# Patient Record
Sex: Female | Born: 1995 | Race: Black or African American | Hispanic: No | Marital: Single | Smoking: Current every day smoker
Health system: Southern US, Community
[De-identification: ages and names within clinical notes are randomized; demographics above are authoritative.]

## PROBLEM LIST (undated history)

## (undated) DIAGNOSIS — F419 Anxiety disorder, unspecified: Secondary | ICD-10-CM

---

## 2015-07-13 ENCOUNTER — Emergency Department (HOSPITAL_COMMUNITY): Payer: Self-pay

## 2015-07-13 ENCOUNTER — Emergency Department (HOSPITAL_COMMUNITY)
Admission: EM | Admit: 2015-07-13 | Discharge: 2015-07-13 | Disposition: A | Discharge status: Home / Self Care | Payer: Self-pay | Attending: Emergency Medicine | Admitting: Emergency Medicine

## 2015-07-13 ENCOUNTER — Encounter (HOSPITAL_COMMUNITY): Payer: Self-pay | Admitting: Emergency Medicine

## 2015-07-13 DIAGNOSIS — Z3202 Encounter for pregnancy test, result negative: Secondary | ICD-10-CM | POA: Insufficient documentation

## 2015-07-13 DIAGNOSIS — Z720 Tobacco use: Secondary | ICD-10-CM | POA: Insufficient documentation

## 2015-07-13 DIAGNOSIS — R079 Chest pain, unspecified: Secondary | ICD-10-CM | POA: Insufficient documentation

## 2015-07-13 HISTORY — DX: Anxiety disorder, unspecified: F41.9

## 2015-07-13 LAB — CBC WITH DIFFERENTIAL/PLATELET
Basophils Absolute: 0 10*3/uL (ref 0.0–0.1)
Basophils Relative: 0 % (ref 0–1)
Eosinophils Absolute: 0.2 10*3/uL (ref 0.0–0.7)
Eosinophils Relative: 2 % (ref 0–5)
HCT: 39.7 % (ref 36.0–46.0)
Hemoglobin: 12.9 g/dL (ref 12.0–15.0)
Lymphocytes Relative: 27 % (ref 12–46)
Lymphs Abs: 1.7 10*3/uL (ref 0.7–4.0)
MCH: 28 pg (ref 26.0–34.0)
MCHC: 32.5 g/dL (ref 30.0–36.0)
MCV: 86.1 fL (ref 78.0–100.0)
Monocytes Absolute: 0.4 10*3/uL (ref 0.1–1.0)
Monocytes Relative: 6 % (ref 3–12)
Neutro Abs: 4.2 10*3/uL (ref 1.7–7.7)
Neutrophils Relative %: 65 % (ref 43–77)
Platelets: 197 10*3/uL (ref 150–400)
RBC: 4.61 MIL/uL (ref 3.87–5.11)
RDW: 13.1 % (ref 11.5–15.5)
WBC: 6.5 10*3/uL (ref 4.0–10.5)

## 2015-07-13 LAB — URINALYSIS, ROUTINE W REFLEX MICROSCOPIC
Bilirubin Urine: NEGATIVE
Glucose, UA: NEGATIVE mg/dL
Hgb urine dipstick: NEGATIVE
Ketones, ur: NEGATIVE mg/dL
Leukocytes, UA: NEGATIVE
Nitrite: NEGATIVE
Protein, ur: NEGATIVE mg/dL
Specific Gravity, Urine: 1.01 (ref 1.005–1.030)
Urobilinogen, UA: 0.2 mg/dL (ref 0.0–1.0)
pH: 7 (ref 5.0–8.0)

## 2015-07-13 LAB — BASIC METABOLIC PANEL
Anion gap: 8 (ref 5–15)
BUN: 13 mg/dL (ref 6–20)
CO2: 27 mmol/L (ref 22–32)
Calcium: 9.4 mg/dL (ref 8.9–10.3)
Chloride: 102 mmol/L (ref 101–111)
Creatinine, Ser: 0.76 mg/dL (ref 0.44–1.00)
GFR calc Af Amer: >60 mL/min (ref >60)
GFR calc non Af Amer: >60 mL/min (ref >60)
Glucose, Bld: 104 mg/dL — ABNORMAL HIGH (ref 65–99)
Potassium: 3.9 mmol/L (ref 3.5–5.1)
Sodium: 137 mmol/L (ref 135–145)

## 2015-07-13 LAB — POC URINE PREG, ED: Preg Test, Ur: NEGATIVE

## 2015-07-13 NOTE — Discharge Instructions (Signed)
Chest Pain (Nonspecific) °It is often hard to give a specific diagnosis for the cause of chest pain. There is always a chance that your pain could be related to something serious, such as a heart attack or a blood clot in the lungs. You need to follow up with your health care provider for further evaluation. °CAUSES  °· Heartburn. °· Pneumonia or bronchitis. °· Anxiety or stress. °· Inflammation around your heart (pericarditis) or lung (pleuritis or pleurisy). °· A blood clot in the lung. °· A collapsed lung (pneumothorax). It can develop suddenly on its own (spontaneous pneumothorax) or from trauma to the chest. °· Shingles infection (herpes zoster virus). °The chest wall is composed of bones, muscles, and cartilage. Any of these can be the source of the pain. °· The bones can be bruised by injury. °· The muscles or cartilage can be strained by coughing or overwork. °· The cartilage can be affected by inflammation and become sore (costochondritis). °DIAGNOSIS  °Lab tests or other studies may be needed to find the cause of your pain. Your health care provider may have you take a test called an ambulatory electrocardiogram (ECG). An ECG records your heartbeat patterns over a 24-hour period. You may also have other tests, such as: °· Transthoracic echocardiogram (TTE). During echocardiography, sound waves are used to evaluate how blood flows through your heart. °· Transesophageal echocardiogram (TEE). °· Cardiac monitoring. This allows your health care provider to monitor your heart rate and rhythm in real time. °· Holter monitor. This is a portable device that records your heartbeat and can help diagnose heart arrhythmias. It allows your health care provider to track your heart activity for several days, if needed. °· Stress tests by exercise or by giving medicine that makes the heart beat faster. °TREATMENT  °· Treatment depends on what may be causing your chest pain. Treatment may include: °· Acid blockers for  heartburn. °· Anti-inflammatory medicine. °· Pain medicine for inflammatory conditions. °· Antibiotics if an infection is present. °· You may be advised to change lifestyle habits. This includes stopping smoking and avoiding alcohol, caffeine, and chocolate. °· You may be advised to keep your head raised (elevated) when sleeping. This reduces the chance of acid going backward from your stomach into your esophagus. °Most of the time, nonspecific chest pain will improve within 2-3 days with rest and mild pain medicine.  °HOME CARE INSTRUCTIONS  °· If antibiotics were prescribed, take them as directed. Finish them even if you start to feel better. °· For the next few days, avoid physical activities that bring on chest pain. Continue physical activities as directed. °· Do not use any tobacco products, including cigarettes, chewing tobacco, or electronic cigarettes. °· Avoid drinking alcohol. °· Only take medicine as directed by your health care provider. °· Follow your health care provider's suggestions for further testing if your chest pain does not go away. °· Keep any follow-up appointments you made. If you do not go to an appointment, you could develop lasting (chronic) problems with pain. If there is any problem keeping an appointment, call to reschedule. °SEEK MEDICAL CARE IF:  °· Your chest pain does not go away, even after treatment. °· You have a rash with blisters on your chest. °· You have a fever. °SEEK IMMEDIATE MEDICAL CARE IF:  °· You have increased chest pain or pain that spreads to your arm, neck, jaw, back, or abdomen. °· You have shortness of breath. °· You have an increasing cough, or you cough   up blood. °· You have severe back or abdominal pain. °· You feel nauseous or vomit. °· You have severe weakness. °· You faint. °· You have chills. °This is an emergency. Do not wait to see if the pain will go away. Get medical help at once. Call your local emergency services (911 in [place] Do not drive  yourself to the hospital. °MAKE SURE YOU:  °· Understand these instructions. °· Will watch your condition. °· Will get help right away if you are not doing well or get worse. °Document Released: 09/04/2005 Document Revised: 11/30/2013 Document Reviewed: 06/30/2008 °ExitCare® Patient Information ©2015 [employer]. This information is not intended to replace advice given to you by your health care provider. Make sure you discuss any questions you have with your health care provider. ° °Panic Attacks °Panic attacks are sudden, short-lived surges of severe anxiety, fear, or discomfort. They may occur for no reason when you are relaxed, when you are anxious, or when you are sleeping. Panic attacks may occur for a number of reasons:  °· Healthy people occasionally have panic attacks in extreme, life-threatening situations, such as war or natural disasters. Normal anxiety is a protective mechanism of the body that helps us react to danger (fight or flight response). °· Panic attacks are often seen with anxiety disorders, such as panic disorder, social anxiety disorder, generalized anxiety disorder, and phobias. Anxiety disorders cause excessive or uncontrollable anxiety. They may interfere with your relationships or other life activities. °· Panic attacks are sometimes seen with other mental illnesses, such as depression and posttraumatic stress disorder. °· Certain medical conditions, prescription medicines, and drugs of abuse can cause panic attacks. °SYMPTOMS  °Panic attacks start suddenly, peak within 20 minutes, and are accompanied by four or more of the following symptoms: °· Pounding heart or fast heart rate (palpitations). °· Sweating. °· Trembling or shaking. °· Shortness of breath or feeling smothered. °· Feeling choked. °· Chest pain or discomfort. °· Nausea or strange feeling in your stomach. °· Dizziness, light-headedness, or feeling like you will faint. °· Chills or hot flushes. °· Numbness or tingling in  your lips or hands and feet. °· Feeling that things are not real or feeling that you are not yourself. °· Fear of losing control or going crazy. °· Fear of dying. °Some of these symptoms can mimic serious medical conditions. For example, you may think you are having a heart attack. Although panic attacks can be very scary, they are not life threatening. °DIAGNOSIS  °Panic attacks are diagnosed through an assessment by your health care provider. Your health care provider will ask questions about your symptoms, such as where and when they occurred. Your health care provider will also ask about your medical history and use of alcohol and drugs, including prescription medicines. Your health care provider may order blood tests or other studies to rule out a serious medical condition. Your health care provider may refer you to a mental health professional for further evaluation. °TREATMENT  °· Most healthy people who have one or two panic attacks in an extreme, life-threatening situation will not require treatment. °· The treatment for panic attacks associated with anxiety disorders or other mental illness typically involves counseling with a mental health professional, medicine, or a combination of both. Your health care provider will help determine what treatment is best for you. °· Panic attacks due to physical illness usually go away with treatment of the illness. If prescription medicine is causing panic attacks, talk with your health care   provider about stopping the medicine, decreasing the dose, or substituting another medicine. °· Panic attacks due to alcohol or drug abuse go away with abstinence. Some adults need professional help in order to stop drinking or using drugs. °HOME CARE INSTRUCTIONS  °· Take all medicines as directed by your health care provider.   °· Schedule and attend follow-up visits as directed by your health care provider. It is important to keep all your appointments. °SEEK MEDICAL CARE  IF: °· You are not able to take your medicines as prescribed. °· Your symptoms do not improve or get worse. °SEEK IMMEDIATE MEDICAL CARE IF:  °· You experience panic attack symptoms that are different than your usual symptoms. °· You have serious thoughts about hurting yourself or others. °· You are taking medicine for panic attacks and have a serious side effect. °MAKE SURE YOU: °· Understand these instructions. °· Will watch your condition. °· Will get help right away if you are not doing well or get worse. °Document Released: 11/25/2005 Document Revised: 11/30/2013 Document Reviewed: 07/09/2013 °ExitCare® Patient Information ©2015 [employer]. This information is not intended to replace advice given to you by your health care provider. Make sure you discuss any questions you have with your health care provider. ° °

## 2015-07-13 NOTE — ED Notes (Signed)
Pt comes in today with EMS from home. Pts boyfriend states they were laying in bed when the pt began to shake and panic. Pt stated to boyfriend that she needed to go to the hospital. Pt actively crying and shaking at bedside at this time.

## 2015-07-13 NOTE — ED Provider Notes (Signed)
CSN: [id number]     Arrival date & time 07/13/15  9562 History   First MD Initiated Contact with Patient 07/13/15 0930     Chief Complaint  Patient presents with  . Panic Attack     (Consider location/radiation/quality/duration/timing/severity/associated sxs/prior Treatment) Patient is a 19 y.o. female presenting with chest pain.  Chest Pain Pain location:  Substernal area Pain quality: sharp   Pain radiates to:  Does not radiate Pain radiates to the back: no   Pain severity:  Moderate Onset quality:  Gradual Duration:  1 day Timing:  Constant Chronicity:  Recurrent Context: not breathing   Context comment:  Increased life stressors, states it feels identical to panic attacks Relieved by:  Nothing Worsened by:  Nothing tried Associated symptoms: no abdominal pain, no fever, no nausea and not vomiting   Associated symptoms comment:  R foot pain   Past Medical History  Diagnosis Date  . Anxiety    History reviewed. No pertinent past surgical history. History reviewed. No pertinent family history. History  Substance Use Topics  . Smoking status: Current Every Day Smoker -- 0.50 packs/day    Types: Cigarettes  . Smokeless tobacco: Never Used  . Alcohol Use: No   OB History    No data available     Review of Systems  Constitutional: Negative for fever.  Cardiovascular: Positive for chest pain.  Gastrointestinal: Negative for nausea, vomiting and abdominal pain.  All other systems reviewed and are negative.     Allergies  Asa  Home Medications   Prior to Admission medications   Not on File   BP 101/58 mmHg  Pulse 68  Temp(Src) 98.3 F (36.8 C) (Oral)  Resp 16  Ht  (1.6 m)  Wt 100 lb (45.36 kg)  BMI 17.72 kg/m2  SpO2 98%  LMP 06/23/2015 Physical Exam  Constitutional: She is oriented to person, place, and time. She appears well-developed and well-nourished.  HENT:  Head: Normocephalic and atraumatic.  Right Ear: External ear normal.  Left Ear:  External ear normal.  Eyes: Conjunctivae and EOM are normal. Pupils are equal, round, and reactive to light.  Neck: Normal range of motion. Neck supple.  Cardiovascular: Normal rate, regular rhythm, normal heart sounds and intact distal pulses.   Pulmonary/Chest: Effort normal and breath sounds normal.  Abdominal: Soft. Bowel sounds are normal. There is no tenderness.  Musculoskeletal: Normal range of motion.  Neurological: She is alert and oriented to person, place, and time.  Skin: Skin is warm and dry.  Vitals reviewed.   ED Course  Procedures (including critical care time) Labs Review Labs Reviewed  URINALYSIS, ROUTINE W REFLEX MICROSCOPIC (NOT AT [facility]) - Abnormal; Notable for the following:    APPearance CLOUDY (*)    All other components within normal limits  BASIC METABOLIC PANEL - Abnormal; Notable for the following:    Glucose, Bld 104 (*)    All other components within normal limits  CBC WITH DIFFERENTIAL/PLATELET  POC URINE PREG, ED    Imaging Review Dg Chest 2 View  07/13/2015   CLINICAL DATA:  Panic attack  EXAM: CHEST  2 VIEW  COMPARISON:  None.  FINDINGS: The heart size and mediastinal contours are within normal limits. Both lungs are clear. The visualized skeletal structures are unremarkable.  IMPRESSION: No active cardiopulmonary disease.   Electronically Signed   By: Natasha Mead M.D.   On: 07/13/2015 10:47   Dg Foot 2 Views Right  07/13/2015   CLINICAL DATA:  Right first metatarsal pain for few days  EXAM: RIGHT FOOT - 2 VIEW  COMPARISON:  None  FINDINGS: Two views of the right foot submitted. No acute fracture or subluxation. No radiopaque foreign body.  IMPRESSION: Negative.   Electronically Signed   By: Natasha Mead M.D.   On: 07/13/2015 10:47     EKG Interpretation   Date/Time:  Thursday July 13 2015 09:02:14 EDT Ventricular Rate:  78 PR Interval:  133 QRS Duration: 66 QT Interval:  374 QTC Calculation: 426 R Axis:   72 Text Interpretation:  Sinus rhythm  Nonspecific T abnormalities, anterior  leads No old tracing to compare Confirmed by Mirian Mo [phone]) on  07/13/2015 11:03:08 AM      MDM   Final diagnoses:  Chest pain, unspecified chest pain type    19 y.o. female with pertinent PMH of panic attacks presents with chest pain as above which she states is identical to prior panic attacks.  She also endorses right foot pain in context of stepping on a hard surface. On arrival vital signs and physical exam as above. Patient not tachycardic, no leg swelling, pain isolated to plantar fascia of foot. Workup obtained as above, unremarkable.  DC home in stable condition.  I have reviewed all laboratory and imaging studies if ordered as above  1. Chest pain, unspecified chest pain type         Mirian Mo, MD 07/14/15 [phone]

## 2015-07-13 NOTE — ED Notes (Signed)
Bed: WA21 Expected date:  Expected time:  Means of arrival:  Comments: Panic attack

## 2015-09-09 ENCOUNTER — Emergency Department (HOSPITAL_COMMUNITY): Payer: Medicaid Other

## 2015-09-09 ENCOUNTER — Emergency Department (HOSPITAL_COMMUNITY)
Admission: EM | Admit: 2015-09-09 | Discharge: 2015-09-09 | Discharge status: Against Medical Advice | Payer: Medicaid Other | Attending: Emergency Medicine | Admitting: Emergency Medicine

## 2015-09-09 ENCOUNTER — Encounter (HOSPITAL_COMMUNITY): Payer: Self-pay | Admitting: Emergency Medicine

## 2015-09-09 DIAGNOSIS — R079 Chest pain, unspecified: Secondary | ICD-10-CM | POA: Insufficient documentation

## 2015-09-09 DIAGNOSIS — R05 Cough: Secondary | ICD-10-CM | POA: Diagnosis not present

## 2015-09-09 DIAGNOSIS — R0602 Shortness of breath: Secondary | ICD-10-CM | POA: Insufficient documentation

## 2015-09-09 DIAGNOSIS — R1110 Vomiting, unspecified: Secondary | ICD-10-CM | POA: Insufficient documentation

## 2015-09-09 DIAGNOSIS — Z720 Tobacco use: Secondary | ICD-10-CM | POA: Insufficient documentation

## 2015-09-09 LAB — BASIC METABOLIC PANEL
Anion gap: 8 (ref 5–15)
BUN: 11 mg/dL (ref 6–20)
CHLORIDE: 103 mmol/L (ref 101–111)
CO2: 25 mmol/L (ref 22–32)
CREATININE: 0.87 mg/dL (ref 0.44–1.00)
Calcium: 9.3 mg/dL (ref 8.9–10.3)
Glucose, Bld: 88 mg/dL (ref 65–99)
Potassium: 4.3 mmol/L (ref 3.5–5.1)
SODIUM: 136 mmol/L (ref 135–145)

## 2015-09-09 LAB — CBC
HCT: 35 % — ABNORMAL LOW (ref 36.0–46.0)
Hemoglobin: 11.2 g/dL — ABNORMAL LOW (ref 12.0–15.0)
MCH: 27.9 pg (ref 26.0–34.0)
MCHC: 32 g/dL (ref 30.0–36.0)
MCV: 87.1 fL (ref 78.0–100.0)
PLATELETS: 223 10*3/uL (ref 150–400)
RBC: 4.02 MIL/uL (ref 3.87–5.11)
RDW: 12.6 % (ref 11.5–15.5)
WBC: 4.6 10*3/uL (ref 4.0–10.5)

## 2015-09-09 LAB — I-STAT TROPONIN, ED: TROPONIN I, POC: 0 ng/mL (ref 0.00–0.08)

## 2015-09-09 NOTE — ED Notes (Signed)
Patient transported to X-ray 

## 2015-09-09 NOTE — ED Notes (Addendum)
Pt states she needs to go to work and she doesn't want to wait until the MD is available. Pt wants to leave against medical advice.

## 2015-09-09 NOTE — ED Notes (Signed)
Pt. reports mid chest pain with SOB , productive cough and emesis onset this week , denies diaphoresis or fever .

## 2015-10-04 ENCOUNTER — Encounter (HOSPITAL_COMMUNITY): Payer: Self-pay | Admitting: Emergency Medicine

## 2015-10-04 ENCOUNTER — Emergency Department (HOSPITAL_COMMUNITY): Payer: Medicaid Other

## 2015-10-04 ENCOUNTER — Emergency Department (HOSPITAL_COMMUNITY)
Admission: EM | Admit: 2015-10-04 | Discharge: 2015-10-04 | Disposition: A | Discharge status: Home / Self Care | Payer: Medicaid Other | Attending: Emergency Medicine | Admitting: Emergency Medicine

## 2015-10-04 DIAGNOSIS — R0602 Shortness of breath: Secondary | ICD-10-CM | POA: Diagnosis not present

## 2015-10-04 DIAGNOSIS — R0789 Other chest pain: Secondary | ICD-10-CM | POA: Diagnosis not present

## 2015-10-04 DIAGNOSIS — Z720 Tobacco use: Secondary | ICD-10-CM | POA: Diagnosis not present

## 2015-10-04 DIAGNOSIS — Z8659 Personal history of other mental and behavioral disorders: Secondary | ICD-10-CM | POA: Insufficient documentation

## 2015-10-04 DIAGNOSIS — R110 Nausea: Secondary | ICD-10-CM | POA: Diagnosis not present

## 2015-10-04 DIAGNOSIS — R079 Chest pain, unspecified: Secondary | ICD-10-CM | POA: Diagnosis present

## 2015-10-04 DIAGNOSIS — R61 Generalized hyperhidrosis: Secondary | ICD-10-CM | POA: Diagnosis not present

## 2015-10-04 DIAGNOSIS — R0781 Pleurodynia: Secondary | ICD-10-CM

## 2015-10-04 LAB — BASIC METABOLIC PANEL
ANION GAP: 11 (ref 5–15)
BUN: 7 mg/dL (ref 6–20)
CO2: 24 mmol/L (ref 22–32)
Calcium: 9.4 mg/dL (ref 8.9–10.3)
Chloride: 104 mmol/L (ref 101–111)
Creatinine, Ser: 0.67 mg/dL (ref 0.44–1.00)
GLUCOSE: 114 mg/dL — AB (ref 65–99)
POTASSIUM: 3.7 mmol/L (ref 3.5–5.1)
Sodium: 139 mmol/L (ref 135–145)

## 2015-10-04 LAB — CBC
HEMATOCRIT: 36.4 % (ref 36.0–46.0)
HEMOGLOBIN: 11.8 g/dL — AB (ref 12.0–15.0)
MCH: 28.7 pg (ref 26.0–34.0)
MCHC: 32.4 g/dL (ref 30.0–36.0)
MCV: 88.6 fL (ref 78.0–100.0)
Platelets: 219 10*3/uL (ref 150–400)
RBC: 4.11 MIL/uL (ref 3.87–5.11)
RDW: 12.8 % (ref 11.5–15.5)
WBC: 4.8 10*3/uL (ref 4.0–10.5)

## 2015-10-04 LAB — D-DIMER, QUANTITATIVE (NOT AT ARMC): D DIMER QUANT: 0.29 ug{FEU}/mL (ref 0.00–0.48)

## 2015-10-04 LAB — I-STAT TROPONIN, ED: Troponin i, poc: 0 ng/mL (ref 0.00–0.08)

## 2015-10-04 MED ORDER — KETOROLAC TROMETHAMINE 30 MG/ML IJ SOLN
30.0000 mg | Freq: Once | INTRAMUSCULAR | Status: AC
  Administered 2015-10-04: 30 mg via INTRAVENOUS
  Filled 2015-10-04: qty 1

## 2015-10-04 NOTE — Discharge Instructions (Signed)
Take 2 naproxen tablets at a time, twice a day. Take acetaminophen as needed for pain. Apply ice and heat as needed. Do not smoke.  Pleurisy Pleurisy is an inflammation and swelling of the lining of the lungs (pleura). Because of this inflammation, it hurts to breathe. It can be aggravated by coughing, laughing, or deep breathing. Pleurisy is often caused by an underlying infection or disease.  HOME CARE INSTRUCTIONS  Monitor your pleurisy for any changes. The following actions may help to alleviate any discomfort you are experiencing:  Medicine may help with pain. Only take over-the-counter or prescription medicines for pain, discomfort, or fever as directed by your health care provider.  Only take antibiotic medicine as directed. Make sure to finish it even if you start to feel better. SEEK MEDICAL CARE IF:   Your pain is not controlled with medicine or is increasing.  You have an increase in pus-like (purulent) secretions brought up with coughing. SEEK IMMEDIATE MEDICAL CARE IF:   You have blue or dark lips, fingernails, or toenails.  You are coughing up blood.  You have increased difficulty breathing.  You have continuing pain unrelieved by medicine or pain lasting more than 1 week.  You have pain that radiates into your neck, arms, or jaw.  You develop increased shortness of breath or wheezing.  You develop a fever, rash, vomiting, fainting, or other serious symptoms. MAKE SURE YOU:  Understand these instructions.   Will watch your condition.   Will get help right away if you are not doing well or get worse.    This information is not intended to replace advice given to you by your health care provider. Make sure you discuss any questions you have with your health care provider.   Document Released: 11/25/2005 Document Revised: 07/28/2013 Document Reviewed: 05/09/2013 Elsevier Interactive Patient Education [employer]2016 [employer] Inc[employer]

## 2015-10-04 NOTE — ED Notes (Signed)
Patient states she started having chest pain and shortness of breath since yesterday.  She states that the pain went away but came back and got worse.  Patient states she has been having some nausea and vomiting with it.

## 2015-10-04 NOTE — ED Notes (Signed)
Reviewed discharge instructions with patient. Patient verbalized understanding.

## 2015-10-04 NOTE — ED Provider Notes (Signed)
CSN: [id number]     Arrival date & time 10/04/15  0134 History   By signing my name below, I, Melanie Bautista, attest that this documentation has been prepared under the direction and in the presence of Melanie Booze, MD.  Electronically Signed: Arlan Bautista, ED Scribe. 10/04/2015. 2:25 AM.   Chief Complaint  Patient presents with  . Chest Pain  . Shortness of Breath   The history is provided by the patient. No language interpreter was used.    HPI Comments: Melanie Bautista is a 19 y.o. female without any pertinent past medical history who presents to the Emergency Department complaining of intermittent, ongoing L sided chest pain x 1 day. Pain is described as pressure. Discomfort is made worse with deep breathing. No alleviating factors at this time. Ongoing shortness of breath, nausea, and diaphoresis also reported. 800 mg Ibuprofen attempted prior to arrival with temporary improvement for pain. No recent fever, chills, or vomiting. Pt was evaluated in the emergency department a few months ago for same but states current episode is worse. Denies any recent long distance travel. No current estrogen use. Ms. Melanie Bautista is a former smoker and quit a week ago.  PCP: No primary care provider on file.    Past Medical History  Diagnosis Date  . Anxiety    No past surgical history on file. No family history on file. Social History  Substance Use Topics  . Smoking status: Current Every Day Smoker -- 0.00 packs/day    Types: Cigarettes  . Smokeless tobacco: Never Used  . Alcohol Use: No   OB History    No data available     Review of Systems  Constitutional: Positive for diaphoresis. Negative for fever and chills.  Respiratory: Positive for shortness of breath. Negative for cough.   Cardiovascular: Positive for chest pain.  Gastrointestinal: Positive for nausea. Negative for vomiting and abdominal pain.  Musculoskeletal: Negative for back pain.  Skin: Negative for rash.  Neurological: Negative  for headaches.  Psychiatric/Behavioral: Negative for confusion.  All other systems reviewed and are negative.     Allergies  Asa  Home Medications   Prior to Admission medications   Medication Sig Start Date End Date Taking? Authorizing Provider  ibuprofen (ADVIL,MOTRIN) 400 MG tablet Take 400 mg by mouth every 6 (six) hours as needed for mild pain.    Historical Provider, MD   Triage Vitals: BP 116/71 mmHg  Pulse 86  Temp(Src) 98.9 F (37.2 C) (Oral)  Resp 18  SpO2 100%  LMP 08/26/2015 (Approximate)   Physical Exam  Constitutional: She is oriented to person, place, and time. She appears well-developed and well-nourished. No distress.  HENT:  Head: Normocephalic and atraumatic.  Eyes: EOM are normal.  Neck: Normal range of motion.  Cardiovascular: Normal rate, regular rhythm and normal heart sounds.   Pulmonary/Chest: Effort normal and breath sounds normal. She exhibits tenderness.  Moderate tenderness to L anterior chest wall  Abdominal: Soft. She exhibits no distension. There is no tenderness.  Musculoskeletal: Normal range of motion.  Neurological: She is alert and oriented to person, place, and time.  Skin: Skin is warm and dry.  Psychiatric: She has a normal mood and affect. Judgment normal.  Nursing note and vitals reviewed.   ED Course  Procedures (including critical care time)  DIAGNOSTIC STUDIES: Oxygen Saturation is 100% on RA, Normal by my interpretation.    COORDINATION OF CARE: 2:20 AM- Will order CXR, D-dimer, i-stat troponin, BMP, EKG, and CBC.  Will  give Toradol. Discussed treatment plan with pt at bedside and pt agreed to plan.     Labs Review Results for orders placed or performed during the hospital encounter of 10/04/15  Basic metabolic panel  Result Value Ref Range   Sodium 139 135 - 145 mmol/L   Potassium 3.7 3.5 - 5.1 mmol/L   Chloride 104 101 - 111 mmol/L   CO2 24 22 - 32 mmol/L   Glucose, Bld 114 (H) 65 - 99 mg/dL   BUN 7 6 - 20  mg/dL   Creatinine, Ser 1.300.67 0.44 - 1.00 mg/dL   Calcium 9.4 8.9 - 86.510.3 mg/dL   GFR calc non Af Amer >60 >60 mL/min   GFR calc Af Amer >60 >60 mL/min   Anion gap 11 5 - 15  CBC  Result Value Ref Range   WBC 4.8 4.0 - 10.5 K/uL   RBC 4.11 3.87 - 5.11 MIL/uL   Hemoglobin 11.8 (L) 12.0 - 15.0 g/dL   HCT 78.436.4 69.636.0 - 29.546.0 %   MCV 88.6 78.0 - 100.0 fL   MCH 28.7 26.0 - 34.0 pg   MCHC 32.4 30.0 - 36.0 g/dL   RDW 28.412.8 13.211.5 - 44.015.5 %   Platelets 219 150 - 400 K/uL  D-dimer, quantitative  Result Value Ref Range   D-Dimer, Quant 0.29 0.00 - 0.48 ug/mL-FEU  [facility]Stat Troponin[facility])  Result Value Ref Range   Troponin i, poc 0.00 0.00 - 0.08 ng/mL   Comment 3           Imaging Review Dg Chest 2 View  10/04/2015  CLINICAL DATA:  19 year old female with history of chest pain and shortness of breath. EXAM: CHEST  2 VIEW COMPARISON:  Chest x-ray 09/09/2015. FINDINGS: Lung volumes are normal. No consolidative airspace disease. No pleural effusions. No pneumothorax. No pulmonary nodule or mass noted. Pulmonary vasculature and the cardiomediastinal silhouette are within normal limits. IMPRESSION: No radiographic evidence of acute cardiopulmonary disease. Electronically Signed   By: Trudie Reedaniel  Entrikin M.D.   On: 10/04/2015 02:18   I have personally reviewed and evaluated these images and lab results as part of my medical decision-making.   EKG Interpretation   Date/Time:  Wednesday October 04 2015 01:41:15 EDT Ventricular Rate:  94 PR Interval:  120 QRS Duration: 58 QT Interval:  312 QTC Calculation: 390 R Axis:   85 Text Interpretation:  Normal sinus rhythm with sinus arrhythmia  Nonspecific T wave abnormality Abnormal ECG When compared with ECG of  09/09/2015, No significant change was found Confirmed by [facility]  MD, Maryiah Olvey  ([id number]) on 10/04/2015 2:18:22 AM      MDM   Final diagnoses:  Pleuritic chest pain    Pleuritic chest pain. No concerning findings on exam, but she did have  some recent automobile travel. He was only 3 hours but she might be at increased risk for PE. She is given a dose of ketorolac and d-dimer was checked. She had significant but not complete improvement of pain with the ketorolac. D-dimer is negative as is chest x-ray. Old records reviewed and she has a prior ED visit for chest pain on August 4. Patient states today's pain is similar but worse. She is reassured of negative workup and is advised to use over-the-counter NSAIDs.  I personally performed the services described in this documentation, which was scribed in my presence. The recorded information has been reviewed and is accurate.      Melanie Boozeavid Joell Usman, MD 10/04/15 [phone]

## 2015-12-09 ENCOUNTER — Emergency Department (HOSPITAL_COMMUNITY): Payer: Medicaid Other

## 2015-12-09 ENCOUNTER — Emergency Department (HOSPITAL_COMMUNITY)
Admission: EM | Admit: 2015-12-09 | Discharge: 2015-12-09 | Disposition: A | Discharge status: Home / Self Care | Payer: Medicaid Other | Attending: Emergency Medicine | Admitting: Emergency Medicine

## 2015-12-09 ENCOUNTER — Encounter (HOSPITAL_COMMUNITY): Payer: Self-pay

## 2015-12-09 DIAGNOSIS — J209 Acute bronchitis, unspecified: Secondary | ICD-10-CM | POA: Diagnosis not present

## 2015-12-09 DIAGNOSIS — R1110 Vomiting, unspecified: Secondary | ICD-10-CM | POA: Diagnosis not present

## 2015-12-09 DIAGNOSIS — J40 Bronchitis, not specified as acute or chronic: Secondary | ICD-10-CM

## 2015-12-09 DIAGNOSIS — F17210 Nicotine dependence, cigarettes, uncomplicated: Secondary | ICD-10-CM | POA: Insufficient documentation

## 2015-12-09 DIAGNOSIS — Z8659 Personal history of other mental and behavioral disorders: Secondary | ICD-10-CM | POA: Diagnosis not present

## 2015-12-09 DIAGNOSIS — R05 Cough: Secondary | ICD-10-CM | POA: Diagnosis present

## 2015-12-09 MED ORDER — AZITHROMYCIN 250 MG PO TABS: 250.0000 mg | ORAL_TABLET | Freq: Every day | ORAL | Status: AC

## 2015-12-09 MED ORDER — BENZONATATE 200 MG PO CAPS: 200.0000 mg | ORAL_CAPSULE | Freq: Three times a day (TID) | ORAL | Status: AC | PRN

## 2015-12-09 NOTE — ED Provider Notes (Signed)
CSN: [id number]     Arrival date & time 12/09/15  1809 History  By signing my name below, I, Melanie Bautista, attest that this documentation has been prepared under the direction and in the presence of Kerrie BuffaloHope Neese, NP.  Electronically Signed: Lyndel SafeKaitlyn Bautista, ED Scribe. 12/09/2015. 7:35 PM.  Chief Complaint  Patient presents with  . Cough   Patient is a 19 y.o. female presenting with cough. The history is provided by the patient. No language interpreter was used.  Cough Cough characteristics:  Productive Sputum characteristics:  Bloody Severity:  Moderate Onset quality:  Gradual Duration:  2 weeks Timing:  Constant Progression:  Worsening Chronicity:  New Smoker: yes   Relieved by:  Nothing Ineffective treatments: Nyquil. Associated symptoms: chills and fever   Fever:    Temp source:  Subjective  HPI Comments: Melanie Bautista is a 19 y.o. female, with no pertinent PMhx, who presents to the Emergency Department complaining of a gradually worsening, constant, moderate cough that has been persistent for 2 weeks, with onset of associated post tussive emesis and hemoptysis 3 days ago. Pt describes hemoptysis to be bright red blood streaked sputum. She has taken 2 doses of Nyquil with some relief but she states she stopped taking this medication 2 weeks ago because it made her fall asleep. She also associates a subjective fever and chills that occurred 2 days ago, that has resolved, and that she did not take any alleviating medication for. Patient is an every day smoker.   Past Medical History  Diagnosis Date  . Anxiety    History reviewed. No pertinent past surgical history. History reviewed. No pertinent family history. Social History  Substance Use Topics  . Smoking status: Current Every Day Smoker -- 0.50 packs/day    Types: Cigarettes  . Smokeless tobacco: Never Used  . Alcohol Use: No   OB History    No data available     Review of Systems  Constitutional: Positive for fever  and chills.  Respiratory: Positive for cough.   Gastrointestinal: Positive for vomiting ( post tussive).  All other systems reviewed and are negative.  Allergies  Asa  Home Medications   Prior to Admission medications   Medication Sig Start Date End Date Taking? Authorizing Provider  azithromycin (ZITHROMAX) 250 MG tablet Take 1 tablet (250 mg total) by mouth daily. Take first 2 tablets together, then 1 every day until finished. 12/09/15   Hope Orlene OchM Neese, NP  benzonatate (TESSALON) 200 MG capsule Take 1 capsule (200 mg total) by mouth 3 (three) times daily as needed for cough. 12/09/15   Hope Orlene OchM Neese, NP  ibuprofen (ADVIL,MOTRIN) 400 MG tablet Take 400 mg by mouth every 6 (six) hours as needed for mild pain.    Historical Provider, MD   BP 98/60 mmHg  Pulse 83  Temp(Src) 98.2 F (36.8 C) (Oral)  Resp 17  Ht 5\' 3"  (1.6 m)  Wt 106 lb 9.6 oz (48.353 kg)  BMI 18.89 kg/m2  SpO2 100%  LMP 12/05/2015 Physical Exam  Constitutional: She is oriented to person, place, and time. She appears well-developed and well-nourished.  HENT:  Head: Normocephalic.  Mouth/Throat: Uvula is midline and oropharynx is clear and moist. No oropharyngeal exudate, posterior oropharyngeal edema or posterior oropharyngeal erythema.  Eyes: EOM are normal.  Neck: Neck supple.  Cardiovascular: Normal rate and regular rhythm.   Pulmonary/Chest: Effort normal and breath sounds normal. No respiratory distress. She has no wheezes. She has no rales.  Lungs clear to  ausculation bilaterally; no wheezing no rales.   Abdominal: Soft. There is no tenderness.  Abdomen soft, non-tender.   Musculoskeletal: Normal range of motion.  Lymphadenopathy:    She has no cervical adenopathy.  Neurological: She is alert and oriented to person, place, and time. No cranial nerve deficit.  Skin: Skin is warm and dry.  Psychiatric: She has a normal mood and affect. Her behavior is normal.  Nursing note and vitals reviewed.   ED Course   Procedures  CXR DIAGNOSTIC STUDIES: Oxygen Saturation is 100% on RA, normal by my interpretation.    COORDINATION OF CARE: 7:29 PM Discussed treatment plan which includes to order chest Xray with pt. Pt acknowledges and agrees to plan.    Imaging Review Dg Chest 2 View  12/09/2015  CLINICAL DATA:  Productive cough and fever x 2 weeks. EXAM: CHEST  2 VIEW COMPARISON:  10/04/2015 FINDINGS: Mild right hemidiaphragm elevation. Midline trachea. Normal heart size and mediastinal contours. No pleural effusion or pneumothorax. Clear lungs. IMPRESSION: No acute cardiopulmonary disease. Electronically Signed   By: Jeronimo Greaves M.D.   On: 12/09/2015 19:49   I have personally reviewed and evaluated these images results as part of my medical decision-making.   MDM   Final diagnoses:  Bronchitis    Pt symptoms consistent with bronchitis. CXR negative for acute infiltrate. Pt will be discharged with symptomatic treatment. Since the patient is an every day smoker will treat with Z-Pak and Tessalon. Discussed hazards of smoking.   Discussed return precautions.  Pt is hemodynamically stable & in NAD prior to discharge.    [address] [place], [place] 12/09/15 0865  Derwood Kaplan, MD 12/10/15 [phone]

## 2015-12-09 NOTE — ED Notes (Signed)
Onset 2 weeks runny nose- yellow thin drainage, nasal congestion and productive cough- white with blood streaks in phlegm.  Cough interfering with sleep.  No respiratory difficulties, speaking in complete sentences, resp e/u.

## 2016-01-06 IMAGING — CR DG FOOT 2V*R*
2 series · 2 of 2 positions shown · non-contrast
Comparison: None

CLINICAL DATA: Right first metatarsal pain for few days

EXAM:
RIGHT FOOT - 2 VIEW

[x foot ap right]
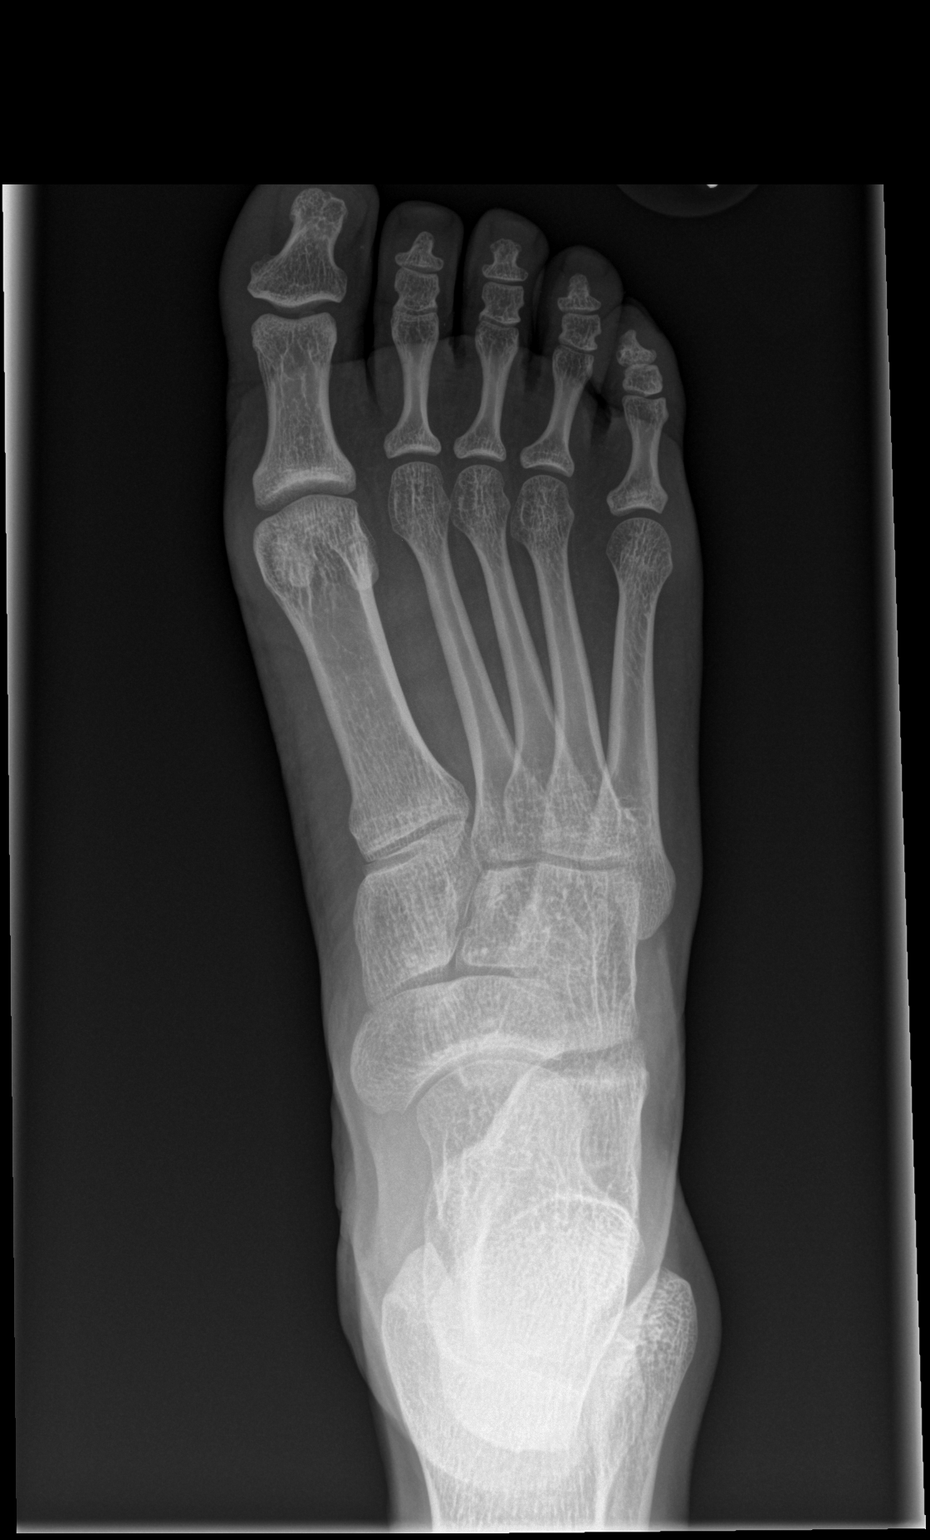

[x foot lat right]
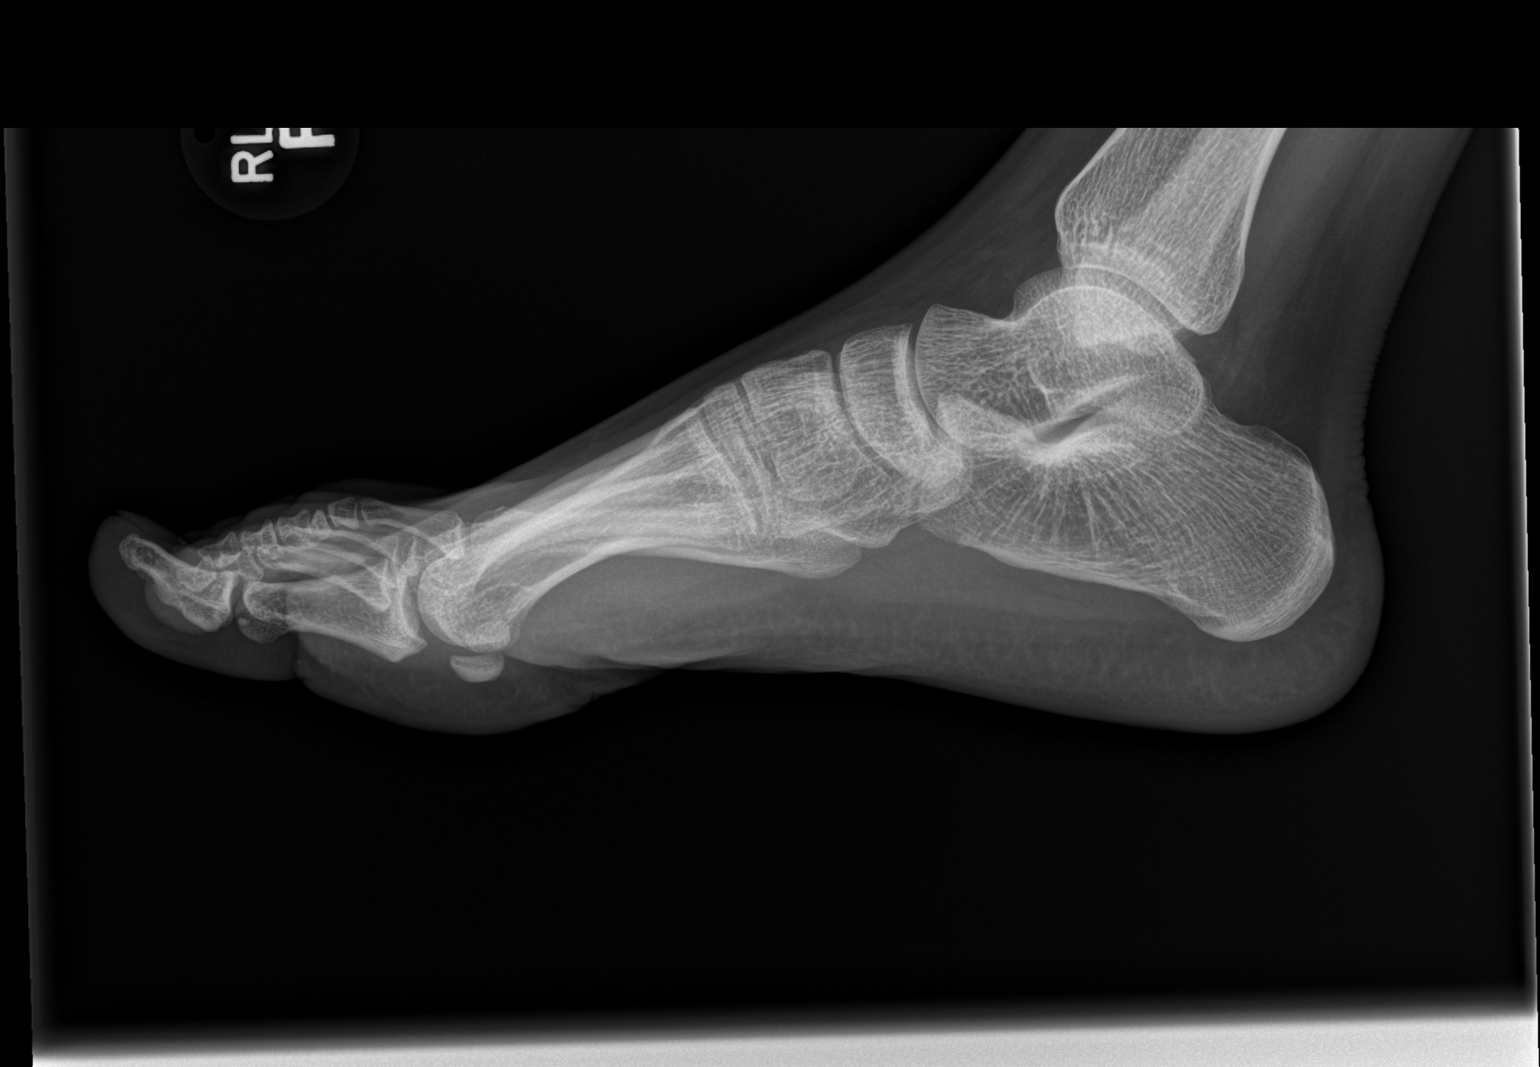

[2 of 2 positions shown; findings below may reference images not displayed]

FINDINGS: Two views of the right foot submitted. No acute fracture or
subluxation. No radiopaque foreign body.
IMPRESSION: Negative.

## 2016-03-04 IMAGING — CR DG CHEST 2V
2 series · 2 of 2 positions shown · non-contrast
Comparison: Chest radiograph performed 07/13/2015

CLINICAL DATA: Acute onset of generalized chest pain, fever and
cough. Headache. Initial encounter.

EXAM:
CHEST  2 VIEW

[chest pa]
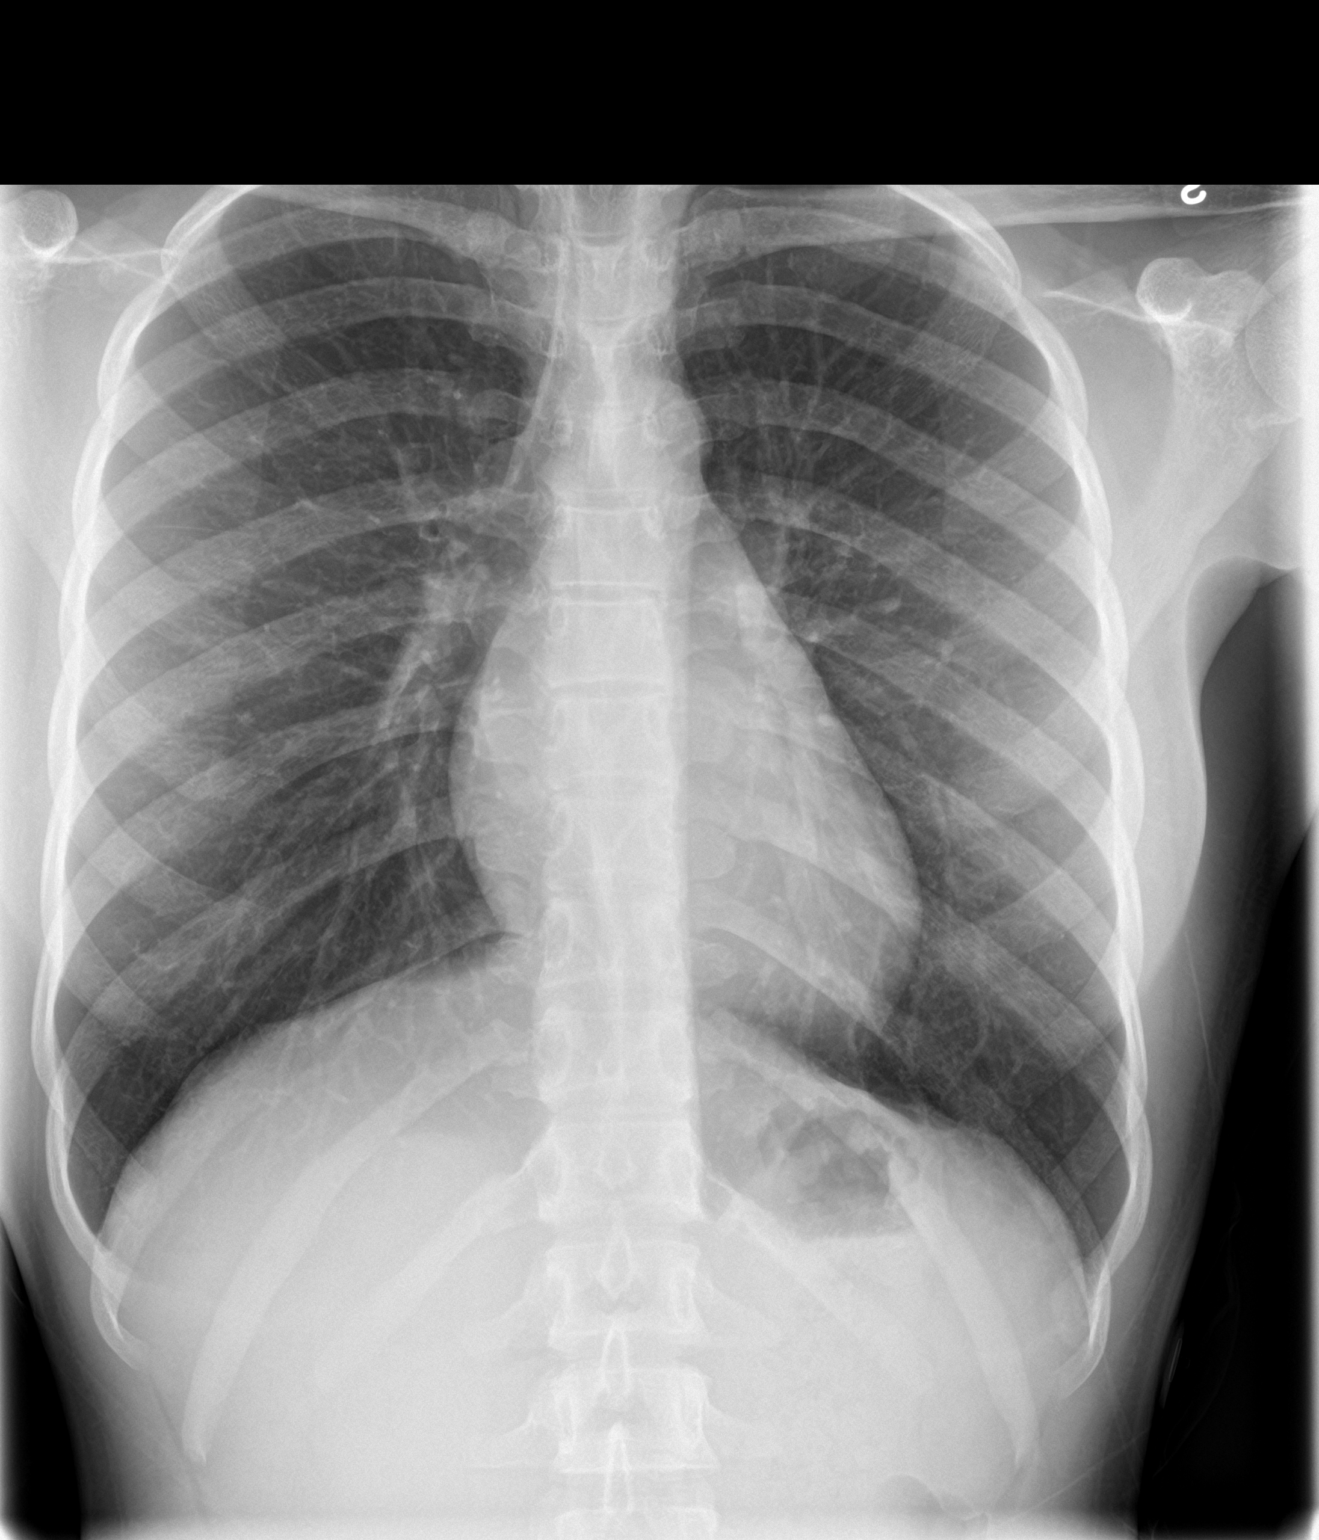

[chest lat]
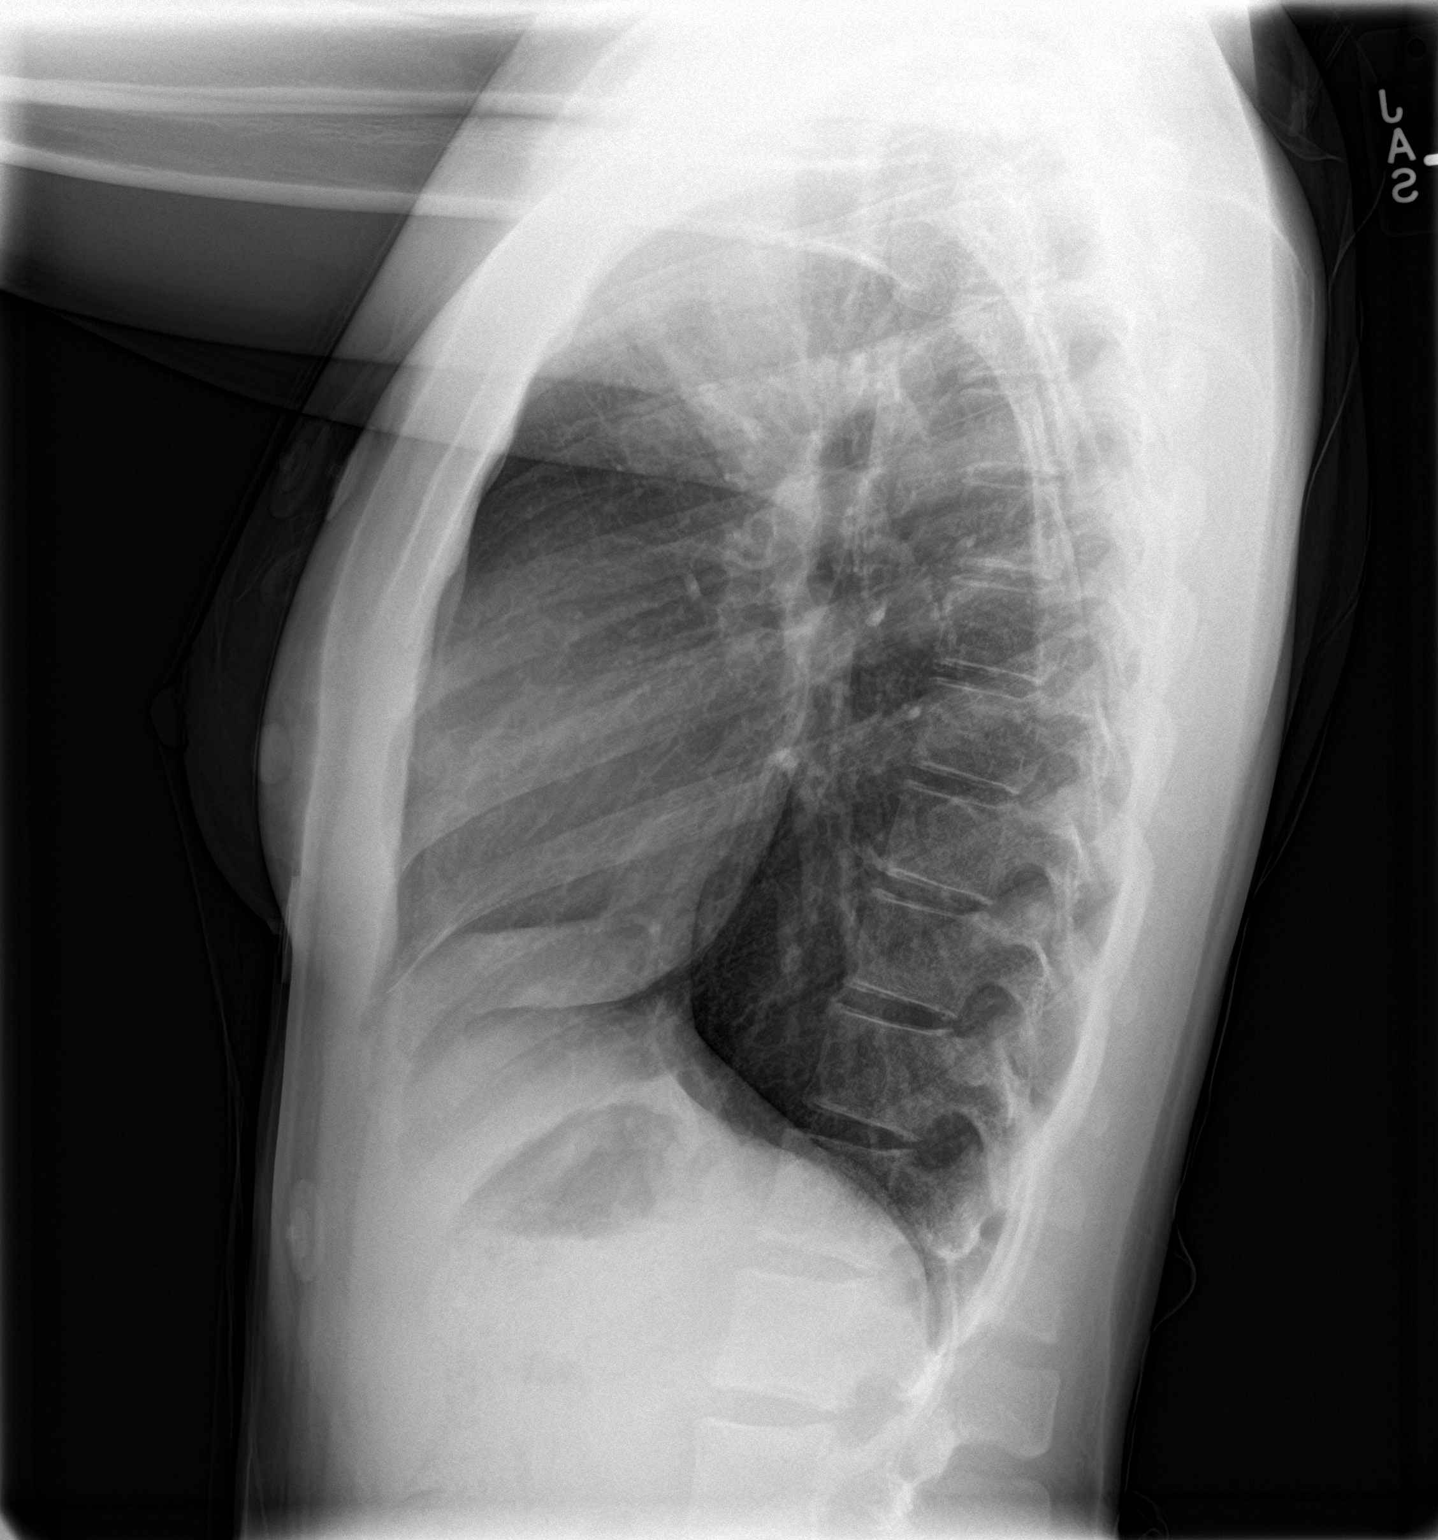

[2 of 2 positions shown; findings below may reference images not displayed]

FINDINGS: The lungs are well-aerated. Mild peribronchial thickening is noted.
There is no evidence of focal opacification, pleural effusion or
pneumothorax.

The heart is normal in size; the mediastinal contour is within
normal limits. No acute osseous abnormalities are seen.
IMPRESSION: Mild peribronchial thickening noted.  Lungs otherwise clear.
